# Patient Record
Sex: Female | Born: 1960 | Race: White | Hispanic: No | Marital: Married
Health system: Southern US, Community
[De-identification: ages and names within clinical notes are randomized; demographics above are authoritative.]

---

## 1998-06-07 ENCOUNTER — Ambulatory Visit (HOSPITAL_COMMUNITY): Admission: RE | Admit: 1998-06-07 | Discharge: 1998-06-07 | Payer: Self-pay | Admitting: Family Medicine

## 1998-06-07 ENCOUNTER — Encounter: Payer: Self-pay | Admitting: Family Medicine

## 1999-01-31 ENCOUNTER — Encounter: Payer: Self-pay | Admitting: Family Medicine

## 1999-01-31 ENCOUNTER — Ambulatory Visit (HOSPITAL_COMMUNITY): Admission: RE | Admit: 1999-01-31 | Discharge: 1999-01-31 | Payer: Self-pay | Admitting: Family Medicine

## 2003-02-01 ENCOUNTER — Ambulatory Visit (HOSPITAL_COMMUNITY): Admission: RE | Admit: 2003-02-01 | Discharge: 2003-02-01 | Payer: Self-pay | Admitting: Family Medicine

## 2003-02-01 ENCOUNTER — Encounter: Payer: Self-pay | Admitting: Family Medicine

## 2004-02-14 ENCOUNTER — Other Ambulatory Visit: Admission: RE | Admit: 2004-02-14 | Discharge: 2004-02-14 | Payer: Self-pay | Admitting: Family Medicine

## 2005-02-21 ENCOUNTER — Other Ambulatory Visit: Admission: RE | Admit: 2005-02-21 | Discharge: 2005-02-21 | Payer: Self-pay | Admitting: Family Medicine

## 2006-02-24 ENCOUNTER — Other Ambulatory Visit: Admission: RE | Admit: 2006-02-24 | Discharge: 2006-02-24 | Payer: Self-pay | Admitting: Family Medicine

## 2007-03-16 ENCOUNTER — Other Ambulatory Visit: Admission: RE | Admit: 2007-03-16 | Discharge: 2007-03-16 | Payer: Self-pay | Admitting: Family Medicine

## 2016-09-24 ENCOUNTER — Other Ambulatory Visit (HOSPITAL_COMMUNITY): Payer: Self-pay | Admitting: Gastroenterology

## 2016-09-24 DIAGNOSIS — R1115 Cyclical vomiting syndrome unrelated to migraine: Secondary | ICD-10-CM

## 2016-10-08 ENCOUNTER — Encounter (HOSPITAL_COMMUNITY): Payer: Self-pay

## 2016-10-08 ENCOUNTER — Ambulatory Visit (HOSPITAL_COMMUNITY): Payer: Self-pay

## 2016-10-31 ENCOUNTER — Encounter (HOSPITAL_COMMUNITY)
Admission: RE | Admit: 2016-10-31 | Discharge: 2016-10-31 | Disposition: A | Discharge status: Home / Self Care | Payer: Managed Care, Other (non HMO) | Source: Ambulatory Visit | Attending: Gastroenterology | Admitting: Gastroenterology

## 2016-10-31 DIAGNOSIS — R1115 Cyclical vomiting syndrome unrelated to migraine: Secondary | ICD-10-CM

## 2016-10-31 DIAGNOSIS — R1110 Vomiting, unspecified: Secondary | ICD-10-CM | POA: Insufficient documentation

## 2016-10-31 MED ORDER — TECHNETIUM TC 99M SULFUR COLLOID
2.0200 | Freq: Once | INTRAVENOUS | Status: AC | PRN
  Administered 2016-10-31: 2.02 via INTRAVENOUS

## 2016-11-04 ENCOUNTER — Other Ambulatory Visit: Payer: Self-pay | Admitting: Gastroenterology

## 2016-11-15 ENCOUNTER — Ambulatory Visit (HOSPITAL_COMMUNITY)
Admission: RE | Admit: 2016-11-15 | Discharge: 2016-11-15 | Disposition: A | Discharge status: Home / Self Care | Payer: Managed Care, Other (non HMO) | Source: Ambulatory Visit | Attending: Gastroenterology | Admitting: Gastroenterology

## 2016-11-15 ENCOUNTER — Encounter (HOSPITAL_COMMUNITY)
Admission: RE | Disposition: A | Discharge status: Home / Self Care | Payer: Self-pay | Source: Ambulatory Visit | Attending: Gastroenterology

## 2016-11-15 DIAGNOSIS — R1110 Vomiting, unspecified: Secondary | ICD-10-CM | POA: Diagnosis not present

## 2016-11-15 HISTORY — PX: ESOPHAGEAL MANOMETRY: SHX5429

## 2016-11-15 SURGERY — MANOMETRY, ESOPHAGUS

## 2016-11-15 MED ORDER — LIDOCAINE VISCOUS 2 % MT SOLN
OROMUCOSAL | Status: AC
  Filled 2016-11-15: qty 15

## 2016-11-15 SURGICAL SUPPLY — 2 items
FACESHIELD LNG OPTICON STERILE (SAFETY) IMPLANT
GLOVE BIO SURGEON STRL SZ8 (GLOVE) ×4 IMPLANT

## 2016-11-15 NOTE — Progress Notes (Signed)
Esophageal Manometry done per protocol. Pt tolerated well. No complications noted. Report to be sent to Dr. Bosie ClosSchooler to read.

## 2016-11-18 ENCOUNTER — Encounter (HOSPITAL_COMMUNITY): Payer: Self-pay | Admitting: Gastroenterology

## 2017-03-10 DIAGNOSIS — K220 Achalasia of cardia: Secondary | ICD-10-CM | POA: Diagnosis not present

## 2017-03-10 DIAGNOSIS — Z09 Encounter for follow-up examination after completed treatment for conditions other than malignant neoplasm: Secondary | ICD-10-CM | POA: Diagnosis not present

## 2017-05-13 DIAGNOSIS — Z23 Encounter for immunization: Secondary | ICD-10-CM | POA: Diagnosis not present

## 2017-05-13 DIAGNOSIS — E785 Hyperlipidemia, unspecified: Secondary | ICD-10-CM | POA: Diagnosis not present

## 2017-05-13 DIAGNOSIS — F419 Anxiety disorder, unspecified: Secondary | ICD-10-CM | POA: Diagnosis not present

## 2017-05-13 DIAGNOSIS — I10 Essential (primary) hypertension: Secondary | ICD-10-CM | POA: Diagnosis not present

## 2017-06-24 DIAGNOSIS — J101 Influenza due to other identified influenza virus with other respiratory manifestations: Secondary | ICD-10-CM | POA: Diagnosis not present

## 2017-06-24 DIAGNOSIS — R509 Fever, unspecified: Secondary | ICD-10-CM | POA: Diagnosis not present

## 2017-07-09 DIAGNOSIS — Z1231 Encounter for screening mammogram for malignant neoplasm of breast: Secondary | ICD-10-CM | POA: Diagnosis not present

## 2017-07-15 DIAGNOSIS — R922 Inconclusive mammogram: Secondary | ICD-10-CM | POA: Diagnosis not present

## 2017-07-16 DIAGNOSIS — I421 Obstructive hypertrophic cardiomyopathy: Secondary | ICD-10-CM | POA: Diagnosis not present

## 2017-07-16 DIAGNOSIS — R0609 Other forms of dyspnea: Secondary | ICD-10-CM | POA: Diagnosis not present

## 2017-07-16 DIAGNOSIS — I10 Essential (primary) hypertension: Secondary | ICD-10-CM | POA: Diagnosis not present

## 2017-07-17 DIAGNOSIS — R001 Bradycardia, unspecified: Secondary | ICD-10-CM | POA: Diagnosis not present

## 2017-11-13 DIAGNOSIS — E785 Hyperlipidemia, unspecified: Secondary | ICD-10-CM | POA: Diagnosis not present

## 2017-11-13 DIAGNOSIS — F419 Anxiety disorder, unspecified: Secondary | ICD-10-CM | POA: Diagnosis not present

## 2017-11-13 DIAGNOSIS — Z0000 Encounter for general adult medical examination without abnormal findings: Secondary | ICD-10-CM | POA: Diagnosis not present

## 2018-01-13 DIAGNOSIS — R921 Mammographic calcification found on diagnostic imaging of breast: Secondary | ICD-10-CM | POA: Diagnosis not present

## 2018-04-29 DIAGNOSIS — I10 Essential (primary) hypertension: Secondary | ICD-10-CM | POA: Diagnosis not present

## 2018-04-29 DIAGNOSIS — F419 Anxiety disorder, unspecified: Secondary | ICD-10-CM | POA: Diagnosis not present

## 2018-04-29 DIAGNOSIS — E785 Hyperlipidemia, unspecified: Secondary | ICD-10-CM | POA: Diagnosis not present

## 2018-04-29 DIAGNOSIS — Z23 Encounter for immunization: Secondary | ICD-10-CM | POA: Diagnosis not present

## 2018-11-13 DIAGNOSIS — Z8041 Family history of malignant neoplasm of ovary: Secondary | ICD-10-CM | POA: Diagnosis not present

## 2018-11-13 DIAGNOSIS — R921 Mammographic calcification found on diagnostic imaging of breast: Secondary | ICD-10-CM | POA: Diagnosis not present

## 2018-12-07 DIAGNOSIS — E785 Hyperlipidemia, unspecified: Secondary | ICD-10-CM | POA: Diagnosis not present

## 2018-12-07 DIAGNOSIS — I10 Essential (primary) hypertension: Secondary | ICD-10-CM | POA: Diagnosis not present

## 2018-12-07 DIAGNOSIS — Z8249 Family history of ischemic heart disease and other diseases of the circulatory system: Secondary | ICD-10-CM | POA: Diagnosis not present

## 2018-12-07 DIAGNOSIS — Z0000 Encounter for general adult medical examination without abnormal findings: Secondary | ICD-10-CM | POA: Diagnosis not present

## 2018-12-07 DIAGNOSIS — F419 Anxiety disorder, unspecified: Secondary | ICD-10-CM | POA: Diagnosis not present
